# Patient Record
Sex: Female | Born: 1975 | Race: White | Hispanic: No | Marital: Married | Smoking: Never smoker
Health system: Southern US, Community
[De-identification: ages and names within clinical notes are randomized; demographics above are authoritative.]

## PROBLEM LIST (undated history)

## (undated) DIAGNOSIS — T7840XA Allergy, unspecified, initial encounter: Secondary | ICD-10-CM

## (undated) DIAGNOSIS — F32A Depression, unspecified: Secondary | ICD-10-CM

## (undated) DIAGNOSIS — F329 Major depressive disorder, single episode, unspecified: Secondary | ICD-10-CM

## (undated) DIAGNOSIS — F419 Anxiety disorder, unspecified: Secondary | ICD-10-CM

## (undated) HISTORY — PX: BREAST REDUCTION SURGERY: SHX8

## (undated) HISTORY — PX: HERNIA REPAIR: SHX51

## (undated) HISTORY — DX: Allergy, unspecified, initial encounter: T78.40XA

## (undated) HISTORY — DX: Depression, unspecified: F32.A

## (undated) HISTORY — PX: BREAST EXCISIONAL BIOPSY: SUR124

## (undated) HISTORY — PX: REDUCTION MAMMAPLASTY: SUR839

## (undated) HISTORY — DX: Major depressive disorder, single episode, unspecified: F32.9

## (undated) HISTORY — PX: GASTRIC FUNDOPLICATION: SHX226

## (undated) HISTORY — DX: Anxiety disorder, unspecified: F41.9

---

## 1995-09-10 HISTORY — PX: OTHER SURGICAL HISTORY: SHX169

## 1998-10-12 ENCOUNTER — Other Ambulatory Visit: Admission: RE | Admit: 1998-10-12 | Discharge: 1998-10-12 | Payer: Self-pay | Admitting: Plastic Surgery

## 1999-09-21 ENCOUNTER — Other Ambulatory Visit: Admission: RE | Admit: 1999-09-21 | Discharge: 1999-09-21 | Payer: Self-pay | Admitting: Obstetrics and Gynecology

## 2000-06-26 ENCOUNTER — Emergency Department (HOSPITAL_COMMUNITY): Admission: EM | Admit: 2000-06-26 | Discharge: 2000-06-26 | Payer: Self-pay | Admitting: Emergency Medicine

## 2000-09-22 ENCOUNTER — Other Ambulatory Visit: Admission: RE | Admit: 2000-09-22 | Discharge: 2000-09-22 | Payer: Self-pay | Admitting: Obstetrics and Gynecology

## 2001-09-10 ENCOUNTER — Other Ambulatory Visit: Admission: RE | Admit: 2001-09-10 | Discharge: 2001-09-10 | Payer: Self-pay | Admitting: Obstetrics and Gynecology

## 2002-04-05 ENCOUNTER — Ambulatory Visit (HOSPITAL_COMMUNITY): Admission: RE | Admit: 2002-04-05 | Discharge: 2002-04-05 | Payer: Self-pay | Admitting: Obstetrics and Gynecology

## 2002-05-20 ENCOUNTER — Other Ambulatory Visit: Admission: RE | Admit: 2002-05-20 | Discharge: 2002-05-20 | Payer: Self-pay | Admitting: Obstetrics and Gynecology

## 2002-11-29 ENCOUNTER — Inpatient Hospital Stay (HOSPITAL_COMMUNITY): Admission: AD | Admit: 2002-11-29 | Discharge: 2002-12-01 | Payer: Self-pay | Admitting: Obstetrics and Gynecology

## 2002-12-02 ENCOUNTER — Encounter: Admission: RE | Admit: 2002-12-02 | Discharge: 2003-01-01 | Payer: Self-pay | Admitting: Obstetrics and Gynecology

## 2003-01-06 ENCOUNTER — Other Ambulatory Visit: Admission: RE | Admit: 2003-01-06 | Discharge: 2003-01-06 | Payer: Self-pay | Admitting: Obstetrics and Gynecology

## 2008-01-27 ENCOUNTER — Inpatient Hospital Stay (HOSPITAL_COMMUNITY): Admission: AD | Admit: 2008-01-27 | Discharge: 2008-01-27 | Payer: Self-pay | Admitting: Obstetrics and Gynecology

## 2008-01-28 ENCOUNTER — Inpatient Hospital Stay (HOSPITAL_COMMUNITY): Admission: AD | Admit: 2008-01-28 | Discharge: 2008-01-28 | Payer: Self-pay | Admitting: *Deleted

## 2008-02-04 ENCOUNTER — Encounter: Admission: RE | Admit: 2008-02-04 | Discharge: 2008-02-04 | Payer: Self-pay | Admitting: Obstetrics and Gynecology

## 2008-02-27 ENCOUNTER — Inpatient Hospital Stay (HOSPITAL_COMMUNITY): Admission: AD | Admit: 2008-02-27 | Discharge: 2008-02-29 | Payer: Self-pay | Admitting: Obstetrics & Gynecology

## 2008-02-28 ENCOUNTER — Encounter (INDEPENDENT_AMBULATORY_CARE_PROVIDER_SITE_OTHER): Payer: Self-pay | Admitting: Obstetrics and Gynecology

## 2008-03-02 ENCOUNTER — Encounter: Admission: RE | Admit: 2008-03-02 | Discharge: 2008-03-29 | Payer: Self-pay | Admitting: Obstetrics and Gynecology

## 2009-08-16 ENCOUNTER — Encounter: Admission: RE | Admit: 2009-08-16 | Discharge: 2009-08-16 | Payer: Self-pay | Admitting: Obstetrics and Gynecology

## 2011-04-11 ENCOUNTER — Other Ambulatory Visit: Payer: Self-pay | Admitting: Obstetrics and Gynecology

## 2011-04-11 DIAGNOSIS — N63 Unspecified lump in unspecified breast: Secondary | ICD-10-CM

## 2011-04-19 ENCOUNTER — Ambulatory Visit
Admission: RE | Admit: 2011-04-19 | Discharge: 2011-04-19 | Disposition: A | Discharge status: Home / Self Care | Payer: BC Managed Care – PPO | Source: Ambulatory Visit | Attending: Obstetrics and Gynecology | Admitting: Obstetrics and Gynecology

## 2011-04-19 DIAGNOSIS — N63 Unspecified lump in unspecified breast: Secondary | ICD-10-CM

## 2011-06-06 LAB — CBC
HCT: 36.1
HCT: 36.8
Hemoglobin: 12.2
Hemoglobin: 12.5
MCHC: 33.8
MCHC: 34.1
MCV: 83.6
MCV: 84.1
Platelets: 229
Platelets: 239
RBC: 4.29
RBC: 4.4
RDW: 13.3
RDW: 13.7
WBC: 10
WBC: 12.9 — ABNORMAL HIGH

## 2011-06-06 LAB — RPR: RPR Ser Ql: NONREACTIVE

## 2013-03-03 ENCOUNTER — Encounter (INDEPENDENT_AMBULATORY_CARE_PROVIDER_SITE_OTHER): Payer: Self-pay | Admitting: General Surgery

## 2013-03-04 ENCOUNTER — Ambulatory Visit (INDEPENDENT_AMBULATORY_CARE_PROVIDER_SITE_OTHER): Payer: BC Managed Care – PPO | Admitting: General Surgery

## 2013-03-04 ENCOUNTER — Telehealth (INDEPENDENT_AMBULATORY_CARE_PROVIDER_SITE_OTHER): Payer: Self-pay

## 2013-03-04 NOTE — Telephone Encounter (Signed)
LMOM checking to see about pt missing her appt today 03/04/13 with Dr Derrell Lolling.

## 2013-03-19 ENCOUNTER — Encounter (INDEPENDENT_AMBULATORY_CARE_PROVIDER_SITE_OTHER): Payer: Self-pay | Admitting: General Surgery

## 2014-05-25 ENCOUNTER — Other Ambulatory Visit: Payer: Self-pay | Admitting: Obstetrics and Gynecology

## 2014-05-25 DIAGNOSIS — N63 Unspecified lump in unspecified breast: Secondary | ICD-10-CM

## 2014-07-05 ENCOUNTER — Other Ambulatory Visit: Payer: Self-pay

## 2014-07-05 DIAGNOSIS — Z1231 Encounter for screening mammogram for malignant neoplasm of breast: Secondary | ICD-10-CM

## 2014-08-08 ENCOUNTER — Ambulatory Visit
Admission: RE | Admit: 2014-08-08 | Discharge: 2014-08-08 | Disposition: A | Discharge status: Home / Self Care | Payer: 59 | Source: Ambulatory Visit

## 2014-08-08 DIAGNOSIS — Z1231 Encounter for screening mammogram for malignant neoplasm of breast: Secondary | ICD-10-CM

## 2015-04-25 ENCOUNTER — Other Ambulatory Visit: Payer: Self-pay | Admitting: Obstetrics and Gynecology

## 2015-04-25 DIAGNOSIS — N83202 Unspecified ovarian cyst, left side: Principal | ICD-10-CM

## 2015-04-25 DIAGNOSIS — N83201 Unspecified ovarian cyst, right side: Secondary | ICD-10-CM

## 2015-04-27 ENCOUNTER — Ambulatory Visit
Admission: RE | Admit: 2015-04-27 | Discharge: 2015-04-27 | Disposition: A | Discharge status: Home / Self Care | Payer: 59 | Source: Ambulatory Visit | Attending: Obstetrics and Gynecology | Admitting: Obstetrics and Gynecology

## 2015-04-27 DIAGNOSIS — N83201 Unspecified ovarian cyst, right side: Secondary | ICD-10-CM

## 2015-04-27 DIAGNOSIS — N83202 Unspecified ovarian cyst, left side: Principal | ICD-10-CM

## 2015-04-27 MED ORDER — IOPAMIDOL (ISOVUE-300) INJECTION 61%
100.0000 mL | Freq: Once | INTRAVENOUS | Status: AC | PRN
  Administered 2015-04-27: 100 mL via INTRAVENOUS

## 2015-06-07 ENCOUNTER — Other Ambulatory Visit: Payer: Self-pay | Admitting: Obstetrics and Gynecology

## 2015-06-09 ENCOUNTER — Encounter (HOSPITAL_COMMUNITY): Payer: Self-pay | Admitting: *Deleted

## 2015-06-14 ENCOUNTER — Ambulatory Visit (HOSPITAL_COMMUNITY): Payer: 59 | Admitting: Anesthesiology

## 2015-06-14 ENCOUNTER — Encounter (HOSPITAL_COMMUNITY): Payer: Self-pay | Admitting: Certified Registered Nurse Anesthetist

## 2015-06-14 ENCOUNTER — Ambulatory Visit (HOSPITAL_COMMUNITY)
Admission: RE | Admit: 2015-06-14 | Discharge: 2015-06-14 | Disposition: A | Discharge status: Home / Self Care | Payer: 59 | Source: Ambulatory Visit | Attending: Obstetrics and Gynecology | Admitting: Obstetrics and Gynecology

## 2015-06-14 ENCOUNTER — Encounter (HOSPITAL_COMMUNITY)
Admission: RE | Disposition: A | Discharge status: Home / Self Care | Payer: Self-pay | Source: Ambulatory Visit | Attending: Obstetrics and Gynecology

## 2015-06-14 DIAGNOSIS — Z881 Allergy status to other antibiotic agents status: Secondary | ICD-10-CM | POA: Insufficient documentation

## 2015-06-14 DIAGNOSIS — Z888 Allergy status to other drugs, medicaments and biological substances status: Secondary | ICD-10-CM | POA: Insufficient documentation

## 2015-06-14 DIAGNOSIS — F329 Major depressive disorder, single episode, unspecified: Secondary | ICD-10-CM | POA: Diagnosis not present

## 2015-06-14 DIAGNOSIS — N801 Endometriosis of ovary: Secondary | ICD-10-CM | POA: Insufficient documentation

## 2015-06-14 DIAGNOSIS — N858 Other specified noninflammatory disorders of uterus: Secondary | ICD-10-CM | POA: Diagnosis not present

## 2015-06-14 DIAGNOSIS — N7011 Chronic salpingitis: Secondary | ICD-10-CM | POA: Insufficient documentation

## 2015-06-14 DIAGNOSIS — F419 Anxiety disorder, unspecified: Secondary | ICD-10-CM | POA: Diagnosis not present

## 2015-06-14 DIAGNOSIS — D271 Benign neoplasm of left ovary: Secondary | ICD-10-CM | POA: Diagnosis not present

## 2015-06-14 HISTORY — PX: ROBOTIC ASSISTED LAPAROSCOPIC OVARIAN CYSTECTOMY: SHX6081

## 2015-06-14 LAB — CBC
HCT: 35.7 % — ABNORMAL LOW (ref 36.0–46.0)
Hemoglobin: 11.2 g/dL — ABNORMAL LOW (ref 12.0–15.0)
MCH: 23.2 pg — ABNORMAL LOW (ref 26.0–34.0)
MCHC: 31.4 g/dL (ref 30.0–36.0)
MCV: 74.1 fL — ABNORMAL LOW (ref 78.0–100.0)
Platelets: 363 10*3/uL (ref 150–400)
RBC: 4.82 MIL/uL (ref 3.87–5.11)
RDW: 17.9 % — ABNORMAL HIGH (ref 11.5–15.5)
WBC: 6.2 10*3/uL (ref 4.0–10.5)

## 2015-06-14 SURGERY — ROBOTIC ASSISTED LAPAROSCOPIC OVARIAN CYSTECTOMY
Anesthesia: General | Site: Abdomen | Laterality: Bilateral

## 2015-06-14 MED ORDER — OXYCODONE-ACETAMINOPHEN 5-325 MG PO TABS
1.0000 | ORAL_TABLET | ORAL | Status: DC | PRN
  Administered 2015-06-14: 1 via ORAL

## 2015-06-14 MED ORDER — GLYCOPYRROLATE 0.2 MG/ML IJ SOLN
INTRAMUSCULAR | Status: AC
  Filled 2015-06-14: qty 3

## 2015-06-14 MED ORDER — ONDANSETRON HCL 4 MG/2ML IJ SOLN
INTRAMUSCULAR | Status: DC | PRN
  Administered 2015-06-14: 4 mg via INTRAVENOUS

## 2015-06-14 MED ORDER — IBUPROFEN 800 MG PO TABS: 800.0000 mg | ORAL_TABLET | Freq: Three times a day (TID) | ORAL | Status: AC | PRN

## 2015-06-14 MED ORDER — SODIUM CHLORIDE 0.9 % IV SOLN
INTRAVENOUS | Status: DC | PRN
  Administered 2015-06-14: 60 mL

## 2015-06-14 MED ORDER — KETOROLAC TROMETHAMINE 30 MG/ML IJ SOLN
INTRAMUSCULAR | Status: AC
  Filled 2015-06-14: qty 1

## 2015-06-14 MED ORDER — ONDANSETRON HCL 4 MG/2ML IJ SOLN: 4.0000 mg | Freq: Once | INTRAMUSCULAR | Status: DC | PRN

## 2015-06-14 MED ORDER — PROPOFOL 10 MG/ML IV BOLUS
INTRAVENOUS | Status: DC | PRN
  Administered 2015-06-14: 200 mg via INTRAVENOUS

## 2015-06-14 MED ORDER — DEXAMETHASONE SODIUM PHOSPHATE 10 MG/ML IJ SOLN
INTRAMUSCULAR | Status: DC | PRN
  Administered 2015-06-14: 8 mg via INTRAVENOUS

## 2015-06-14 MED ORDER — KETOROLAC TROMETHAMINE 30 MG/ML IJ SOLN
INTRAMUSCULAR | Status: DC | PRN
  Administered 2015-06-14: 30 mg via INTRAVENOUS
  Administered 2015-06-14: 30 mg via INTRAMUSCULAR

## 2015-06-14 MED ORDER — FENTANYL CITRATE (PF) 100 MCG/2ML IJ SOLN
INTRAMUSCULAR | Status: AC
  Filled 2015-06-14: qty 2

## 2015-06-14 MED ORDER — NEOSTIGMINE METHYLSULFATE 10 MG/10ML IV SOLN
INTRAVENOUS | Status: AC
  Filled 2015-06-14: qty 1

## 2015-06-14 MED ORDER — MIDAZOLAM HCL 2 MG/2ML IJ SOLN
INTRAMUSCULAR | Status: DC | PRN
  Administered 2015-06-14: 2 mg via INTRAVENOUS

## 2015-06-14 MED ORDER — FENTANYL CITRATE (PF) 250 MCG/5ML IJ SOLN
INTRAMUSCULAR | Status: AC
  Filled 2015-06-14: qty 25

## 2015-06-14 MED ORDER — MIDAZOLAM HCL 2 MG/2ML IJ SOLN
INTRAMUSCULAR | Status: AC
  Filled 2015-06-14: qty 4

## 2015-06-14 MED ORDER — ROCURONIUM BROMIDE 100 MG/10ML IV SOLN
INTRAVENOUS | Status: DC | PRN
  Administered 2015-06-14: 40 mg via INTRAVENOUS
  Administered 2015-06-14: 10 mg via INTRAVENOUS
  Administered 2015-06-14: 5 mg via INTRAVENOUS

## 2015-06-14 MED ORDER — ONDANSETRON HCL 4 MG/2ML IJ SOLN
INTRAMUSCULAR | Status: AC
  Filled 2015-06-14: qty 2

## 2015-06-14 MED ORDER — ROPIVACAINE HCL 5 MG/ML IJ SOLN
INTRAMUSCULAR | Status: AC
  Filled 2015-06-14: qty 30

## 2015-06-14 MED ORDER — NEOSTIGMINE METHYLSULFATE 10 MG/10ML IV SOLN
INTRAVENOUS | Status: DC | PRN
  Administered 2015-06-14: 3 mg via INTRAVENOUS

## 2015-06-14 MED ORDER — BUPIVACAINE HCL (PF) 0.25 % IJ SOLN
INTRAMUSCULAR | Status: DC | PRN
  Administered 2015-06-14: 14 mL

## 2015-06-14 MED ORDER — LACTATED RINGERS IR SOLN
Status: DC | PRN
  Administered 2015-06-14: 3000 mL

## 2015-06-14 MED ORDER — LIDOCAINE HCL (CARDIAC) 20 MG/ML IV SOLN
INTRAVENOUS | Status: DC | PRN
  Administered 2015-06-14: 50 mg via INTRAVENOUS

## 2015-06-14 MED ORDER — GLYCOPYRROLATE 0.2 MG/ML IJ SOLN
INTRAMUSCULAR | Status: DC | PRN
  Administered 2015-06-14: 0.4 mg via INTRAVENOUS

## 2015-06-14 MED ORDER — PROPOFOL 10 MG/ML IV BOLUS
INTRAVENOUS | Status: AC
  Filled 2015-06-14: qty 20

## 2015-06-14 MED ORDER — OXYCODONE-ACETAMINOPHEN 5-325 MG PO TABS: 1.0000 | ORAL_TABLET | ORAL | Status: DC | PRN

## 2015-06-14 MED ORDER — BUPIVACAINE HCL (PF) 0.25 % IJ SOLN
INTRAMUSCULAR | Status: AC
  Filled 2015-06-14: qty 30

## 2015-06-14 MED ORDER — SCOPOLAMINE 1 MG/3DAYS TD PT72
MEDICATED_PATCH | TRANSDERMAL | Status: AC
  Administered 2015-06-14: 1.5 mg via TRANSDERMAL
  Filled 2015-06-14: qty 1

## 2015-06-14 MED ORDER — OXYCODONE-ACETAMINOPHEN 5-325 MG PO TABS
ORAL_TABLET | ORAL | Status: AC
  Filled 2015-06-14: qty 1

## 2015-06-14 MED ORDER — SODIUM CHLORIDE 0.9 % IJ SOLN
INTRAMUSCULAR | Status: AC
  Filled 2015-06-14: qty 10

## 2015-06-14 MED ORDER — LIDOCAINE HCL (PF) 1 % IJ SOLN
INTRAMUSCULAR | Status: AC
  Filled 2015-06-14: qty 5

## 2015-06-14 MED ORDER — ARTIFICIAL TEARS OP OINT
TOPICAL_OINTMENT | OPHTHALMIC | Status: AC
  Filled 2015-06-14: qty 3.5

## 2015-06-14 MED ORDER — DEXAMETHASONE SODIUM PHOSPHATE 4 MG/ML IJ SOLN
INTRAMUSCULAR | Status: AC
  Filled 2015-06-14: qty 1

## 2015-06-14 MED ORDER — SCOPOLAMINE 1 MG/3DAYS TD PT72
1.0000 | MEDICATED_PATCH | Freq: Once | TRANSDERMAL | Status: DC
  Administered 2015-06-14: 1.5 mg via TRANSDERMAL

## 2015-06-14 MED ORDER — FENTANYL CITRATE (PF) 100 MCG/2ML IJ SOLN
INTRAMUSCULAR | Status: DC | PRN
  Administered 2015-06-14 (×2): 50 ug via INTRAVENOUS
  Administered 2015-06-14: 25 ug via INTRAVENOUS
  Administered 2015-06-14: 50 ug via INTRAVENOUS

## 2015-06-14 MED ORDER — SODIUM CHLORIDE 0.9 % IJ SOLN
INTRAMUSCULAR | Status: AC
  Filled 2015-06-14: qty 50

## 2015-06-14 MED ORDER — FENTANYL CITRATE (PF) 100 MCG/2ML IJ SOLN
25.0000 ug | INTRAMUSCULAR | Status: DC | PRN
  Administered 2015-06-14 (×2): 50 ug via INTRAVENOUS

## 2015-06-14 MED ORDER — LACTATED RINGERS IV SOLN
INTRAVENOUS | Status: DC
  Administered 2015-06-14: 125 mL/h via INTRAVENOUS
  Administered 2015-06-14: 11:00:00 via INTRAVENOUS

## 2015-06-14 MED ORDER — FENTANYL CITRATE (PF) 100 MCG/2ML IJ SOLN
INTRAMUSCULAR | Status: AC
  Filled 2015-06-14: qty 4

## 2015-06-14 SURGICAL SUPPLY — 61 items
BAG SPEC RTRVL LRG 6X4 10 (ENDOMECHANICALS) ×2
BARRIER ADHS 3X4 INTERCEED (GAUZE/BANDAGES/DRESSINGS) ×5 IMPLANT
BRR ADH 4X3 ABS CNTRL BYND (GAUZE/BANDAGES/DRESSINGS) ×2
CATH FOLEY 3WAY  5CC 16FR (CATHETERS)
CATH FOLEY 3WAY 5CC 16FR (CATHETERS) ×1 IMPLANT
CHLORAPREP W/TINT 26ML (MISCELLANEOUS) ×3 IMPLANT
CONT PATH 16OZ SNAP LID 3702 (MISCELLANEOUS) ×3 IMPLANT
COVER BACK TABLE 60X90IN (DRAPES) ×6 IMPLANT
COVER TIP SHEARS 8 DVNC (MISCELLANEOUS) ×1 IMPLANT
COVER TIP SHEARS 8MM DA VINCI (MISCELLANEOUS) ×2
DECANTER SPIKE VIAL GLASS SM (MISCELLANEOUS) ×3 IMPLANT
DEVICE TROCAR PUNCTURE CLOSURE (ENDOMECHANICALS) IMPLANT
DRAPE WARM FLUID 44X44 (DRAPE) ×3 IMPLANT
DRSG COVADERM PLUS 2X2 (GAUZE/BANDAGES/DRESSINGS) ×12 IMPLANT
DRSG OPSITE POSTOP 3X4 (GAUZE/BANDAGES/DRESSINGS) ×5 IMPLANT
ELECT REM PT RETURN 9FT ADLT (ELECTROSURGICAL) ×3
ELECTRODE REM PT RTRN 9FT ADLT (ELECTROSURGICAL) ×1 IMPLANT
GAUZE VASELINE 3X9 (GAUZE/BANDAGES/DRESSINGS) IMPLANT
GLOVE BIO SURGEON STRL SZ 6.5 (GLOVE) ×2 IMPLANT
GLOVE BIO SURGEON STRL SZ7 (GLOVE) ×12 IMPLANT
GLOVE BIO SURGEONS STRL SZ 6.5 (GLOVE) ×2
GLOVE BIOGEL PI IND STRL 7.0 (GLOVE) ×2 IMPLANT
GLOVE BIOGEL PI INDICATOR 7.0 (GLOVE) ×4
GLOVE ECLIPSE 6.5 STRL STRAW (GLOVE) ×3 IMPLANT
GLOVE INDICATOR 6.5 STRL GRN (GLOVE) ×4 IMPLANT
GLOVE INDICATOR 7.0 STRL GRN (GLOVE) ×2 IMPLANT
KIT ACCESSORY DA VINCI DISP (KITS) ×2
KIT ACCESSORY DVNC DISP (KITS) ×1 IMPLANT
LIQUID BAND (GAUZE/BANDAGES/DRESSINGS) ×3 IMPLANT
NEEDLE INSUFFLATION 120MM (ENDOMECHANICALS) ×3 IMPLANT
NS IRRIG 1000ML POUR BTL (IV SOLUTION) ×9 IMPLANT
OCCLUDER COLPOPNEUMO (BALLOONS) IMPLANT
PACK ROBOT WH (CUSTOM PROCEDURE TRAY) ×3 IMPLANT
PACK ROBOTIC GOWN (GOWN DISPOSABLE) ×3 IMPLANT
PAD POSITIONING PINK XL (MISCELLANEOUS) ×3 IMPLANT
PAD PREP 24X48 CUFFED NSTRL (MISCELLANEOUS) ×6 IMPLANT
POUCH SPECIMEN RETRIEVAL 10MM (ENDOMECHANICALS) ×4 IMPLANT
SCISSORS LAP 5X35 DISP (ENDOMECHANICALS) ×2 IMPLANT
SET CYSTO W/LG BORE CLAMP LF (SET/KITS/TRAYS/PACK) IMPLANT
SET IRRIG TUBING LAPAROSCOPIC (IRRIGATION / IRRIGATOR) ×3 IMPLANT
SET TRI-LUMEN FLTR TB AIRSEAL (TUBING) IMPLANT
SUT VIC AB 0 CT1 27 (SUTURE) ×15
SUT VIC AB 0 CT1 27XBRD ANTBC (SUTURE) ×5 IMPLANT
SUT VICRYL 0 UR6 27IN ABS (SUTURE) ×3 IMPLANT
SUT VICRYL 4-0 PS2 18IN ABS (SUTURE) ×6 IMPLANT
SYR 50ML LL SCALE MARK (SYRINGE) ×3 IMPLANT
SYSTEM CONVERTIBLE TROCAR (TROCAR) IMPLANT
TIP UTERINE 5.1X6CM LAV DISP (MISCELLANEOUS) IMPLANT
TIP UTERINE 6.7X10CM GRN DISP (MISCELLANEOUS) ×2 IMPLANT
TIP UTERINE 6.7X6CM WHT DISP (MISCELLANEOUS) IMPLANT
TIP UTERINE 6.7X8CM BLUE DISP (MISCELLANEOUS) IMPLANT
TOWEL OR 17X24 6PK STRL BLUE (TOWEL DISPOSABLE) ×9 IMPLANT
TRAY FOLEY BAG SILVER LF 16FR (SET/KITS/TRAYS/PACK) ×3 IMPLANT
TROCAR DISP BLADELESS 8 DVNC (TROCAR) ×1 IMPLANT
TROCAR DISP BLADELESS 8MM (TROCAR) ×2
TROCAR OPTI TIP 12M 100M (ENDOMECHANICALS) ×3 IMPLANT
TROCAR PORT AIRSEAL 5X120 (TROCAR) IMPLANT
TROCAR XCEL NON-BLD 11X100MML (ENDOMECHANICALS) ×2 IMPLANT
TROCAR Z-THREAD 12X150 (TROCAR) ×3 IMPLANT
TUBING INSUFFLATION 10FT LAP (TUBING) ×2 IMPLANT
WARMER LAPAROSCOPE (MISCELLANEOUS) ×3 IMPLANT

## 2015-06-14 NOTE — Anesthesia Postprocedure Evaluation (Signed)
Anesthesia Post Note  Patient: Karen Higgins  Procedure(s) Performed: Procedure(s) (LRB): ROBOTIC ASSISTED LAPAROSCOPIC BILATERAL OVARIAN CYSTECTOMY/RIGHT SALPINGECTOMY/EXTENSIVE LYSIS OF ADHESIONS X 25 MIN      bILATERAL OVARIAN CYSTECTOMY   (Bilateral)  Anesthesia type: General  Patient location: PACU  Post pain: Pain level controlled  Post assessment: Post-op Vital signs reviewed  Last Vitals:  Filed Vitals:   06/14/15 1300  BP: 115/67  Pulse: 83  Temp:   Resp: 16    Post vital signs: Reviewed  Level of consciousness: sedated  Complications: No apparent anesthesia complications

## 2015-06-14 NOTE — Anesthesia Procedure Notes (Signed)
Procedure Name: Intubation Date/Time: 06/14/2015 9:02 AM Performed by: Bufford Spikes Pre-anesthesia Checklist: Patient identified, Timeout performed, Emergency Drugs available, Suction available and Patient being monitored Patient Re-evaluated:Patient Re-evaluated prior to inductionOxygen Delivery Method: Circle system utilized Preoxygenation: Pre-oxygenation with 100% oxygen Intubation Type: IV induction Ventilation: Mask ventilation without difficulty Laryngoscope Size: Miller and 2 Grade View: Grade I Tube size: 7.0 mm Number of attempts: 1 Airway Equipment and Method: Stylet Placement Confirmation: ETT inserted through vocal cords under direct vision,  breath sounds checked- equal and bilateral and positive ETCO2 Secured at: 19 (at teeth) cm Tube secured with: Tape Dental Injury: Teeth and Oropharynx as per pre-operative assessment

## 2015-06-14 NOTE — Brief Op Note (Signed)
06/14/2015  12:01 PM  PATIENT:  Karen Higgins  39 y.o. female  PRE-OPERATIVE DIAGNOSIS:  Bilateral Complex Ovarian Cysts, Left Hydrosalpinx  POST-OPERATIVE DIAGNOSIS:  left ovarian dermoid cyst, right Hydrosalpinx, stage IV endometriosis , right ovarian endometrioma  PROCEDURE:  Procedure(s): ROBOTIC ASSISTED LAPAROSCOPIC BILATERAL OVARIAN CYSTECTOMY/RIGHT SALPINGECTOMY/EXTENSIVE LYSIS OF ADHESIONS X 60 MIN      bILATERAL OVARIAN CYSTECTOMY   (Bilateral)  SURGEON:  Surgeon(s) and Role:    * Servando Salina, MD - Primary  PHYSICIAN ASSISTANT:   ASSISTANTS: Lilian Coma, CNM   ANESTHESIA:   general Findings: left ovarian dermoid, left peritubal adhesion, obliteration of posterior cul de sac, right ovarian endometrioma extending into cul de sac, right hydrosalpinx, nl left tube once lysis of adhesion, stage IV endometriosis EBL:  Total I/O In: 1000 [I.V.:1000] Out: 500 [Urine:200; Blood:300]  BLOOD ADMINISTERED:none  DRAINS: none   LOCAL MEDICATIONS USED:  MARCAINE    and BUPIVICAINE   SPECIMEN:  Source of Specimen:  right tube, right ovarian cyst wall, left dermoid cyst  DISPOSITION OF SPECIMEN:  PATHOLOGY  COUNTS:  YES  TOURNIQUET:  * No tourniquets in log *  DICTATION: .Other Dictation: Dictation Number [id number]  PLAN OF CARE: Discharge to home after PACU  PATIENT DISPOSITION:  PACU - hemodynamically stable.   Delay start of Pharmacological VTE agent (>24hrs) due to surgical blood loss or risk of bleeding: no

## 2015-06-14 NOTE — Discharge Instructions (Signed)
CALL  IF TEMP>100.4, NOTHING PER VAGINA X 2 WK, CALL IF SOAKING A MAXI  PAD EVERY HOUR OR MORE FREQUENTLY. Warm heat to abdomen every 4 hrs x 24 hrs ambulate

## 2015-06-14 NOTE — Transfer of Care (Signed)
Immediate Anesthesia Transfer of Care Note  Patient: Karen Higgins  Procedure(s) Performed: Procedure(s): ROBOTIC ASSISTED LAPAROSCOPIC BILATERAL OVARIAN CYSTECTOMY/RIGHT SALPINGECTOMY/EXTENSIVE LYSIS OF ADHESIONS X 74 MIN      bILATERAL OVARIAN CYSTECTOMY   (Bilateral)  Patient Location: PACU  Anesthesia Type:General  Level of Consciousness: awake and sedated  Airway & Oxygen Therapy: Patient Spontanous Breathing and Patient connected to nasal cannula oxygen  Post-op Assessment: Report given to RN and Post -op Vital signs reviewed and stable  Post vital signs: Reviewed and stable  Last Vitals:  Filed Vitals:   06/14/15 0718  BP: 125/77  Pulse: 72  Temp: 37 C  Resp: 16    Complications: No apparent anesthesia complications

## 2015-06-14 NOTE — Anesthesia Preprocedure Evaluation (Addendum)
Anesthesia Evaluation  Patient identified by MRN, date of birth, ID band Patient awake    Reviewed: Allergy & Precautions, NPO status , Patient's Chart, lab work & pertinent test results  History of Anesthesia Complications Negative for: history of anesthetic complications  Airway Mallampati: II  TM Distance: >3 FB Neck ROM: Full    Dental no notable dental hx. (+) Dental Advisory Given   Pulmonary neg pulmonary ROS,    Pulmonary exam normal breath sounds clear to auscultation       Cardiovascular negative cardio ROS Normal cardiovascular exam Rhythm:Regular Rate:Normal     Neuro/Psych PSYCHIATRIC DISORDERS Anxiety Depression negative neurological ROS     GI/Hepatic negative GI ROS, Neg liver ROS,   Endo/Other  negative endocrine ROS  Renal/GU negative Renal ROS  negative genitourinary   Musculoskeletal negative musculoskeletal ROS (+)   Abdominal   Peds negative pediatric ROS (+)  Hematology negative hematology ROS (+)   Anesthesia Other Findings   Reproductive/Obstetrics negative OB ROS                             Anesthesia Physical Anesthesia Plan  ASA: II  Anesthesia Plan: General   Post-op Pain Management:    Induction: Intravenous  Airway Management Planned: Oral ETT  Additional Equipment:   Intra-op Plan:   Post-operative Plan: Extubation in OR  Informed Consent: I have reviewed the patients History and Physical, chart, labs and discussed the procedure including the risks, benefits and alternatives for the proposed anesthesia with the patient or authorized representative who has indicated his/her understanding and acceptance.   Dental advisory given  Plan Discussed with: CRNA  Anesthesia Plan Comments:         Anesthesia Quick Evaluation

## 2015-06-15 ENCOUNTER — Encounter (HOSPITAL_COMMUNITY): Payer: Self-pay | Admitting: Obstetrics and Gynecology

## 2015-06-15 NOTE — Op Note (Signed)
NAMEJOURI, THREAT                ACCOUNT NO.:  [id number]  MEDICAL RECORD NO.:  [id number]  LOCATION:  [facility]                          FACILITY:  [facility]  PHYSICIAN:  Servando Salina, M.D.DATE OF BIRTH:  02/15/1976  DATE OF PROCEDURE:  06/14/2015 DATE OF DISCHARGE:  06/14/2015                              OPERATIVE REPORT   PREOPERATIVE DIAGNOSES:  Left ovarian dermoid cyst.  Bilateral complex ovarian cyst, right ovarian endometrioma, left hydrosalpinx.  PROCEDURE:  Da Vinci robotic bilateral ovarian cystectomy, extensive lysis of adhesions lasting 60 minutes, right salpingectomy.  POSTOPERATIVE DIAGNOSES:  Stage IV pelvic endometriosis.  Left ovarian dermoid cyst.  Right hydrosalpinx.  ANESTHESIA:  General.  SURGEON:  Servando Salina, M.D.  ASSISTANT:  Wilford Corner, CNM.  DESCRIPTION OF PROCEDURE:  Under adequate general anesthesia, the patient was placed in the dorsal lithotomy position.  She was positioned for robotic surgery.  The patient was sterilely prepped and draped in usual fashion.  An indwelling Foley catheter was sterilely placed. Retractors were placed in the vagina.  Cervix was parous.  The single- tooth tenaculum was placed on the anterior lip of the cervix.  The uterus sounded to 10 cm.  A #10 RUMI manipulator was introduced into the uterine cavity.  The tenaculum was removed.  The retractors were removed.  Attention was then turned to the abdomen, supraumbilical location, a 4.26% Marcaine was injected.  Vertical incision was made. Veress needle was introduced and tested with sterile water.  Opening pressure of 7 was noted, 2.7 liters of CO2 was insufflated.  Veress needle was then removed.  A 12-mm disposable trocar with sleeve was introduced into the abdomen without incident.  The robotic camera was then placed through that port.  Initial inspection revealed a single omental adhesion to the anterior abdominal wall on the right.  Normal liver edge was  noted and the patient was placed in Trendelenburg position.  There was a large left ovary containing a cystic structure. The uterus was normal with some periserosal surface adhesions and posteriorly, the posterior cul-de-sac was obliterated.  The right adnexa was adherent to the pelvic sidewall on the right.  Decision was then made to proceed with a robotic approach.  Using 10-cm distances, the 8 mm  robotic port sites on the left x2 was placed.  On the right side, a #11 assistant port was placed in the right midquadrant.  On the right, was another robotic port placed under direct visualization.  At that point, the robot was docked to the patient's left side and in arm #1, the monopolar scissor was placed; arm #2, the Prograsp was placed; in arm #3, the PK dissector was placed.  I then went to the surgical console.  At that point, the pelvis was further inspected.  No endometriosis was noted in the anterior cul-de- sac.  There were some surface of endometriosis noted on the left fundal area.  The left fallopian tube was twisted on the left ovary in a proximal manner from an adhesion.  There were some left lower quadrant adhesions as well.  Posteriorly, it was clearly evident that there was omental and bowel adhesions in the posterior cul-de-sac.  The right  ovary was not immediately identifiable and it clearly was evident that the distal end of the right fallopian tube was encased in the mass in the posterior cul-de-sac.  Using the uterine manipulator, the uterus was anteverted and allowing for better exposure in the posterior cul-de-sac. At that point, sharp dissection was then done with carefully removing of the bowel and omentum adhesions from the posterior cul-de-sac.  At which time, when it was initially started by peeling off the adhesion of the posterior aspect of the uterus, the incidental rupture of the cyst occurred with copious blood admixed with chocolate fluid was  obtained, that defects was further opened for the cyst wall to be seen and  once the cyst wall was identified, continued dissection of that ovarian cyst off the wall in the cul-de-sac was then carried out in multiple pieces and pieces removed through the assistant port.  Once that cyst was removed, it was then noted there was an adjacent daughter cyst with surrounding fluid was noted.  The right tube was then freed up at its distal portion, which was bleeding.  The right ureter was seen peristalsing directly into that area of adnexa that was adherent to the pelvic sidewall.  That additional cystic mass was then carefully removed.  It was then noted that the left uterosacral ligament was pulled up onto the left side and heaped up as well displacing the posterior cul-de-sac that was identified and cleared out from adhesions on the right.  Continued sharp and blunt dissection were then done and carefully brought out the adhesions on the left off the posterior aspect of the uterus and re-establishing the posterior cul-de-sac anatomy.  It was noted that evidence of the rectosigmoid in that area also being pulled up, but that was carefully brought down with blunt dissection. The abdomen, at that point, was irrigated and suctioned.  There was very small remaining portion of the right ovary.  At that point, once the extensive adhesiolysis had been performed with carefully bringing down the dissection, attention was then turned to the right fallopian tubes and given the torturous and swollen appearance, the decision was made to proceed with removal of that tube.  The third arm was then used to grasp the tube and the mesosalpinx was carefully, serially clamped, cauterized, and then cut.  It was subsequently just started in the proximal portion of the fallopian tube attachment to the uterus and carried proximally.  As it cleared halfway through the fallopian tube, it was easily noted that this also  could be removed off with some blunt dissection and this was done as well.  The ureter was kept in view.  The remnant of the right ovary could be seen.  Abdomen again was irrigated and suctioned.  Attention was then turned to the opposite side where the peritubal adhesions on the left proximally was lysed.  The Endobag was placed through the assistant port and brought underneath that large 5-cm approximately cystic mass on that left ovary.  Using the monopolar scissors, an incision was then made overlying the what appears to be the cystic portion of the ovary.  Copious amount of sebaceous, adhered material was exposed into the Endobag and the entire dermoid cystic mass was then removed.  Once that was removed, the bag was closed and brought up into the assistant port site.  Initial struggles continued in trying to remove the specimen out of that bag, which was very firm.  At that point, there was an opening in the  bag in the process of trying to keep it stabilize.  However, minimal fluid that come out of that because the specimen had congealed.  At that point, the specimen incidentally came out of the bag from the opposite side from having opened from areas of where the attempt of removing tissue as occurred.  It fell into the field, but remained intact.  The torn Endobag was removed and the assistant port was then reinserted.  A new Endobag was then used to bring in the specimen in the field.  At that point, once it was brought in the new bag, the opportunity was then taken to use the scissors to cut up the specimen in small pieces and in the process of doing that, incidental bone and/or teeth was found.  Once that was done, we were still able to bring up again the specimen in the bag in the assistant port site and cut in the bag, brought out the pieces of it until I was able to be remove through that assistant port site in total. At that point, again, the pelvis was irrigated and  suctioned.  The left ovary was reidentified and any small tissue of ovarian cystic tissue was removed.  Good hemostasis was achieved.  The posterior cul-de-sac area was then again inspected, no active bleeding was noted.  The serosal adhesions on the uterus were carefully removed and at that point, the procedure was felt to be completed.  The robotic instruments were then removed.  The robot was undocked.  I then went back to the patient's bedside sterilely, reinspected the pelvis, some bleeding was noted near the right ovarian tissue and using the Kleppinger, that area was carefully cauterized as well.  The abdomen was then irrigated and suctioned.  Interceed were then placed surrounding the left ovary and in the posterior cul-de-sac.  Bupivacaine was then injected in the pelvis for postoperative pain management.  The uterine manipulator was then removed.  The robotic port sites were removed as was the assistant ports and the incisions were then closed with 4-0 Vicryl subcuticular stitch. Fascial closures were done with 0 Vicryl figure-of-eight suture.  SPECIMEN:  The right fallopian tube along with the right ovarian cyst wall, the left ovarian dermoid cyst, sent to Pathology.  ESTIMATED BLOOD LOSS:  50 mL.  INTRAOPERATIVE FLUIDS:  1500 mL.  URINE OUTPUT:  About 100 mL.  COUNTS:  Sponge and instrument counts x2 were correct.  COMPLICATION:  None.  The patient tolerated the procedure very well, was transferred to the recovery room in stable condition.     Servando Salina, M.D.     [place]/MEDQ  D:  06/14/2015  T:  06/15/2015  Job:  [id number]

## 2015-06-23 ENCOUNTER — Ambulatory Visit (INDEPENDENT_AMBULATORY_CARE_PROVIDER_SITE_OTHER): Payer: 59 | Admitting: Physician Assistant

## 2015-06-23 VITALS — BP 131/84 | HR 74 | Temp 99.0°F | Resp 16 | Ht 63.5 in | Wt 190.0 lb

## 2015-06-23 DIAGNOSIS — N924 Excessive bleeding in the premenopausal period: Secondary | ICD-10-CM

## 2015-06-23 DIAGNOSIS — L309 Dermatitis, unspecified: Secondary | ICD-10-CM | POA: Diagnosis not present

## 2015-06-23 DIAGNOSIS — N83299 Other ovarian cyst, unspecified side: Secondary | ICD-10-CM | POA: Diagnosis not present

## 2015-06-23 DIAGNOSIS — N920 Excessive and frequent menstruation with regular cycle: Secondary | ICD-10-CM | POA: Insufficient documentation

## 2015-06-23 LAB — POCT CBC
Granulocyte percent: 58.6 % (ref 37–80)
HCT, POC: 33.4 % — AB (ref 37.7–47.9)
Hemoglobin: 11.2 g/dL — AB (ref 12.2–16.2)
Lymph, poc: 3.2 (ref 0.6–3.4)
MCH, POC: 23.7 pg — AB (ref 27–31.2)
MCHC: 33.5 g/dL (ref 31.8–35.4)
MCV: 70.9 fL — AB (ref 80–97)
MID (cbc): 1.1 — AB (ref 0–0.9)
MPV: 7.9 fL (ref 0–99.8)
POC Granulocyte: 6 (ref 2–6.9)
POC LYMPH PERCENT: 31.2 % (ref 10–50)
POC MID %: 10.2 % (ref 0–12)
Platelet Count, POC: 347 10*3/uL (ref 142–424)
RBC: 4.71 M/uL (ref 4.04–5.48)
RDW, POC: 17.2 %
WBC: 10.3 10*3/uL — AB (ref 4.6–10.2)

## 2015-06-23 MED ORDER — PREDNISONE 20 MG PO TABS: ORAL_TABLET | ORAL | Status: AC

## 2015-06-23 NOTE — Progress Notes (Signed)
Subjective:   Patient ID: Karen Higgins, female     DOB: June 03, 1976, 39 y.o.    MRN: [id number]  PCP: No primary care provider on file.  Chief Complaint  Patient presents with  . Allergic Reaction    from dermabond at incision sites    HPI  Presents for evaluation of itchy rash on the abdomen.  She is accompanied today by her husband, Juluis Rainier, and their younger son, Reichen.  On 10/05 she underwent a laparoscopic ovarian cystectomy with Dr. Garwin Brothers. Several days later, she developed an itchy rash around the Dermabond used to close her surgical wounds. She was re-evaluated at GYN 2 days ago and the Dermabond was removed. She was prescribed clindamycin for possible cellulitis, and Vistaril. When she had not improved yesterday, Prednisone was called in (10 mg QD). She returned to see Dr. Garwin Brothers today who told her that there was no infection, but that she was having a systemic reaction and may need an epi-pen. She was advised to come here.  Her skin is very itchy and very sensitive. Even the waist of her undergarments is uncomfortable.  She's noticed small red bumps spreading out over her abdomen, but not to her back, chest or legs.  No shortness of breath, chest tightness, lip or tongue swelling or ticking, no throat symptoms.  Prior to Admission medications   Medication Sig Start Date End Date Taking? Authorizing Provider  clindamycin (CLEOCIN) 300 MG capsule Take 300 mg by mouth every 8 (eight) hours.   Yes Historical Provider, MD  hydrOXYzine (ATARAX/VISTARIL) 10 MG tablet Take 10 mg by mouth 3 (three) times daily.    Yes Historical Provider, MD  ibuprofen (ADVIL,MOTRIN) 800 MG tablet Take 1 tablet (800 mg total) by mouth every 8 (eight) hours as needed. 06/14/15  Yes Servando Salina, MD  predniSONE (DELTASONE) 10 MG tablet Take 10 mg by mouth daily with breakfast.   Yes Historical Provider, MD     Allergies  Allergen Reactions  . Erythromycin Nausea And Vomiting  . Other       Ceclor Caps hives     There are no active problems to display for this patient.    History reviewed. No pertinent family history.   Social History   Social History  . Marital Status: Married    Spouse Name: Joneen Boers  . Number of Children: 2  . Years of Education: BFA   Occupational History  . Government social research officer   Social History Main Topics  . Smoking status: Never Smoker   . Smokeless tobacco: Never Used  . Alcohol Use: No  . Drug Use: No  . Sexual Activity: Not on file   Other Topics Concern  . Not on file   Social History Narrative   BFA from [employer].   Lives with her husband and their 2 sons.        Review of Systems  Constitutional: Negative for fever and chills.  HENT: Negative for congestion, drooling, sore throat, trouble swallowing and voice change.   Eyes: Negative for visual disturbance.  Respiratory: Negative for cough, choking, chest tightness, shortness of breath, wheezing and stridor.   Cardiovascular: Negative for chest pain, palpitations and leg swelling.  Gastrointestinal: Negative for nausea and vomiting.  Skin: Positive for rash and wound.  Neurological: Negative for dizziness, weakness, light-headedness, numbness and headaches.         Objective:  Physical Exam  Constitutional: She is oriented to person, place,  and time. She appears well-developed and well-nourished. She is active and cooperative. No distress.  BP 131/84 mmHg  Pulse 74  Temp(Src) 99 F (37.2 C)  Resp 16  Ht 5' 3.5" (1.613 m)  Wt 190 lb (86.183 kg)  BMI 33.12 kg/m2  LMP 06/07/2015   Eyes: Conjunctivae are normal.  Cardiovascular: Normal rate, regular rhythm, normal heart sounds and intact distal pulses.   Pulmonary/Chest: Effort normal and breath sounds normal.  Neurological: She is alert and oriented to person, place, and time.  Skin: Skin is warm and dry. Lesion (well-healing surgical incisions) and rash noted. Rash is  maculopapular (large patches of erythema surrounding each incision site. Scattered tiny papules covering the anterior abdomen.).  Psychiatric: She has a normal mood and affect. Her speech is normal and behavior is normal.     Results for orders placed or performed in visit on 06/23/15  POCT CBC  Result Value Ref Range   WBC 10.3 (A) 4.6 - 10.2 K/uL   Lymph, poc 3.2 0.6 - 3.4   POC LYMPH PERCENT 31.2 10 - 50 %L   MID (cbc) 1.1 (A) 0 - 0.9   POC MID % 10.2 0 - 12 %M   POC Granulocyte 6.0 2 - 6.9   Granulocyte percent 58.6 37 - 80 %G   RBC 4.71 4.04 - 5.48 M/uL   Hemoglobin 11.2 (A) 12.2 - 16.2 g/dL   HCT, POC 33.4 (A) 37.7 - 47.9 %   MCV 70.9 (A) 80 - 97 fL   MCH, POC 23.7 (A) 27 - 31.2 pg   MCHC 33.5 31.8 - 35.4 g/dL   RDW, POC 17.2 %   Platelet Count, POC 347 142 - 424 K/uL   MPV 7.9 0 - 99.8 fL           Assessment & Plan:  1. Dermatitis Likely local reaction to Dermabond, but cannot exclude reaction to clindamycin. Doubt cellulitis at this point. Stop Clindamycin. If she develops pain at the incisions, will re-start antibiotics, but select an alternative agent (Septra or Doxy). No oral or respiratory symptoms, so epinephrine or inhaled or IV steroids are not indicated. Increase oral prednisone dose to 60-60-60-40-40-40-20-20-20. Hold off on ibuprofen while on this. Continue Vistaril at HS. Use OTC non-sedating oral antihistamine for daytime. If develops oral or respiratory symptoms, go to the ER. Otherwise, follow-up here in 2 days with me. - POCT CBC - predniSONE (DELTASONE) 20 MG tablet; Take 3 PO QAM x3days, 2 PO QAM x3days, 1 PO QAM x3days  Dispense: 18 tablet; Refill: 0   Fara Chute, PA-C Physician Assistant-Certified [facility]

## 2015-06-23 NOTE — Patient Instructions (Signed)
Stop the clindamycin (Cleocin). Start the new prescription for prednisone. You may continue to use the hydroxyzine (Atarax/Vistaril), but I recommend that you reserve it for bedtime. Take an OTC non-sedating oral antihistamine (like Allegra or Claritin) each morning.

## 2016-07-02 ENCOUNTER — Other Ambulatory Visit: Payer: Self-pay | Admitting: Obstetrics and Gynecology

## 2016-07-02 DIAGNOSIS — R1031 Right lower quadrant pain: Secondary | ICD-10-CM

## 2016-07-05 ENCOUNTER — Other Ambulatory Visit: Payer: 59

## 2016-07-09 ENCOUNTER — Other Ambulatory Visit: Payer: 59

## 2016-10-11 ENCOUNTER — Other Ambulatory Visit: Payer: Self-pay | Admitting: Obstetrics and Gynecology

## 2016-10-11 DIAGNOSIS — N644 Mastodynia: Secondary | ICD-10-CM

## 2016-10-16 ENCOUNTER — Ambulatory Visit
Admission: RE | Admit: 2016-10-16 | Discharge: 2016-10-16 | Disposition: A | Discharge status: Home / Self Care | Payer: 59 | Source: Ambulatory Visit | Attending: Obstetrics and Gynecology | Admitting: Obstetrics and Gynecology

## 2016-10-16 DIAGNOSIS — N644 Mastodynia: Secondary | ICD-10-CM

## 2017-08-29 ENCOUNTER — Other Ambulatory Visit: Payer: Self-pay | Admitting: Obstetrics and Gynecology

## 2017-08-29 DIAGNOSIS — N644 Mastodynia: Secondary | ICD-10-CM

## 2017-09-05 ENCOUNTER — Ambulatory Visit
Admission: RE | Admit: 2017-09-05 | Discharge: 2017-09-05 | Disposition: A | Discharge status: Home / Self Care | Payer: 59 | Source: Ambulatory Visit | Attending: Obstetrics and Gynecology | Admitting: Obstetrics and Gynecology

## 2017-09-05 DIAGNOSIS — N644 Mastodynia: Secondary | ICD-10-CM

## 2018-01-05 ENCOUNTER — Other Ambulatory Visit: Payer: Self-pay | Admitting: Obstetrics and Gynecology

## 2018-01-05 DIAGNOSIS — Z1231 Encounter for screening mammogram for malignant neoplasm of breast: Secondary | ICD-10-CM

## 2018-01-06 ENCOUNTER — Other Ambulatory Visit: Payer: Self-pay | Admitting: Obstetrics and Gynecology

## 2018-01-06 DIAGNOSIS — N644 Mastodynia: Secondary | ICD-10-CM

## 2018-01-07 ENCOUNTER — Ambulatory Visit
Admission: RE | Admit: 2018-01-07 | Discharge: 2018-01-07 | Disposition: A | Discharge status: Home / Self Care | Payer: 59 | Source: Ambulatory Visit | Attending: Obstetrics and Gynecology | Admitting: Obstetrics and Gynecology

## 2018-01-07 ENCOUNTER — Ambulatory Visit: Payer: 59

## 2018-01-07 DIAGNOSIS — N644 Mastodynia: Secondary | ICD-10-CM

## 2019-01-18 ENCOUNTER — Other Ambulatory Visit: Payer: Self-pay | Admitting: Obstetrics and Gynecology

## 2019-01-18 DIAGNOSIS — Z1231 Encounter for screening mammogram for malignant neoplasm of breast: Secondary | ICD-10-CM

## 2019-01-25 ENCOUNTER — Ambulatory Visit
Admission: RE | Admit: 2019-01-25 | Discharge: 2019-01-25 | Disposition: A | Discharge status: Home / Self Care | Payer: BC Managed Care – PPO | Source: Ambulatory Visit | Attending: Obstetrics and Gynecology | Admitting: Obstetrics and Gynecology

## 2019-01-25 ENCOUNTER — Other Ambulatory Visit: Payer: Self-pay

## 2019-01-25 DIAGNOSIS — Z1231 Encounter for screening mammogram for malignant neoplasm of breast: Secondary | ICD-10-CM

## 2019-08-23 ENCOUNTER — Other Ambulatory Visit: Payer: Self-pay | Admitting: Obstetrics and Gynecology

## 2019-08-23 DIAGNOSIS — Z1231 Encounter for screening mammogram for malignant neoplasm of breast: Secondary | ICD-10-CM

## 2019-10-22 ENCOUNTER — Ambulatory Visit: Payer: Self-pay

## 2019-10-22 ENCOUNTER — Ambulatory Visit: Payer: BC Managed Care – PPO

## 2020-01-27 ENCOUNTER — Other Ambulatory Visit: Payer: Self-pay

## 2020-01-27 ENCOUNTER — Ambulatory Visit
Admission: RE | Admit: 2020-01-27 | Discharge: 2020-01-27 | Disposition: A | Discharge status: Home / Self Care | Payer: BC Managed Care – PPO | Source: Ambulatory Visit | Attending: Obstetrics and Gynecology | Admitting: Obstetrics and Gynecology

## 2020-01-27 DIAGNOSIS — Z1231 Encounter for screening mammogram for malignant neoplasm of breast: Secondary | ICD-10-CM

## 2020-01-28 ENCOUNTER — Ambulatory Visit: Payer: BC Managed Care – PPO

## 2022-01-28 ENCOUNTER — Other Ambulatory Visit: Payer: Self-pay | Admitting: Family Medicine

## 2022-01-28 DIAGNOSIS — Z1231 Encounter for screening mammogram for malignant neoplasm of breast: Secondary | ICD-10-CM

## 2022-02-20 ENCOUNTER — Ambulatory Visit
Admission: RE | Admit: 2022-02-20 | Discharge: 2022-02-20 | Disposition: A | Discharge status: Home / Self Care | Payer: BC Managed Care – PPO | Source: Ambulatory Visit | Attending: Family Medicine | Admitting: Family Medicine

## 2022-02-20 DIAGNOSIS — Z1231 Encounter for screening mammogram for malignant neoplasm of breast: Secondary | ICD-10-CM

## 2022-06-22 ENCOUNTER — Emergency Department (HOSPITAL_BASED_OUTPATIENT_CLINIC_OR_DEPARTMENT_OTHER): Payer: BC Managed Care – PPO

## 2022-06-22 ENCOUNTER — Encounter (HOSPITAL_BASED_OUTPATIENT_CLINIC_OR_DEPARTMENT_OTHER): Payer: Self-pay | Admitting: Radiology

## 2022-06-22 ENCOUNTER — Other Ambulatory Visit: Payer: Self-pay

## 2022-06-22 ENCOUNTER — Emergency Department (HOSPITAL_BASED_OUTPATIENT_CLINIC_OR_DEPARTMENT_OTHER)
Admission: EM | Admit: 2022-06-22 | Discharge: 2022-06-23 | Disposition: A | Discharge status: Home / Self Care | Payer: BC Managed Care – PPO | Attending: Emergency Medicine | Admitting: Emergency Medicine

## 2022-06-22 DIAGNOSIS — D72829 Elevated white blood cell count, unspecified: Secondary | ICD-10-CM | POA: Diagnosis not present

## 2022-06-22 DIAGNOSIS — J069 Acute upper respiratory infection, unspecified: Secondary | ICD-10-CM | POA: Diagnosis not present

## 2022-06-22 DIAGNOSIS — R0602 Shortness of breath: Secondary | ICD-10-CM | POA: Diagnosis present

## 2022-06-22 DIAGNOSIS — J45909 Unspecified asthma, uncomplicated: Secondary | ICD-10-CM | POA: Diagnosis not present

## 2022-06-22 LAB — CBC
HCT: 46.3 % — ABNORMAL HIGH (ref 36.0–46.0)
Hemoglobin: 15.2 g/dL — ABNORMAL HIGH (ref 12.0–15.0)
MCH: 27.7 pg (ref 26.0–34.0)
MCHC: 32.8 g/dL (ref 30.0–36.0)
MCV: 84.3 fL (ref 80.0–100.0)
Platelets: 301 10*3/uL (ref 150–400)
RBC: 5.49 MIL/uL — ABNORMAL HIGH (ref 3.87–5.11)
RDW: 14.6 % (ref 11.5–15.5)
WBC: 11.4 10*3/uL — ABNORMAL HIGH (ref 4.0–10.5)
nRBC: 0 % (ref 0.0–0.2)

## 2022-06-22 LAB — D-DIMER, QUANTITATIVE: D-Dimer, Quant: 0.82 ug/mL-FEU — ABNORMAL HIGH (ref 0.00–0.50)

## 2022-06-22 LAB — BASIC METABOLIC PANEL
Anion gap: 15 (ref 5–15)
BUN: 8 mg/dL (ref 6–20)
CO2: 21 mmol/L — ABNORMAL LOW (ref 22–32)
Calcium: 9 mg/dL (ref 8.9–10.3)
Chloride: 103 mmol/L (ref 98–111)
Creatinine, Ser: 0.67 mg/dL (ref 0.44–1.00)
GFR, Estimated: >60 mL/min (ref >60)
Glucose, Bld: 88 mg/dL (ref 70–99)
Potassium: 3.6 mmol/L (ref 3.5–5.1)
Sodium: 139 mmol/L (ref 135–145)

## 2022-06-22 MED ORDER — ALBUTEROL SULFATE HFA 108 (90 BASE) MCG/ACT IN AERS: 2.0000 | INHALATION_SPRAY | RESPIRATORY_TRACT | Status: DC | PRN

## 2022-06-22 MED ORDER — IOHEXOL 350 MG/ML SOLN
100.0000 mL | Freq: Once | INTRAVENOUS | Status: AC | PRN
  Administered 2022-06-22: 75 mL via INTRAVENOUS

## 2022-06-22 NOTE — ED Notes (Addendum)
Secure chat sent to Dr. Rogene Houston to ensure provider aware of elevated D-Dimer 0.82

## 2022-06-22 NOTE — ED Triage Notes (Signed)
Patient arrives ambulatory to traige with complaints worsening shortness of breath and coughing x1 week. Patient states that she was started on antibiotics by her pcp and prescribed breathing breathing treatments due to her asthma.    Patient still was not feeling well and she went to urgent care today. Sent her to rule out PE due to an abnormal xray.

## 2022-06-22 NOTE — ED Notes (Signed)
Late entry -- Pt asking if it is time for another breathing tx -- advised pt would have RT evaluate her --Abigail Butts RRT has since been in to see pt (refer to RT notes for further)

## 2022-06-22 NOTE — ED Notes (Signed)
Pt has now returned from [facility] via stretcher  - awake and alert; no acute changes noted

## 2022-06-22 NOTE — ED Notes (Signed)
RT called to assess pt. Pt states she has a hx of asthma. RT unable to find PFT or diagnosis of asthma for pt. Pt BLBS clear throughout w/no wheezing respiratory status stable w/no distress noted at this time. Pt appears to be very apprehensive/anxious. Pt given cool mist aerosol nebulizer for congestion/inflammation.RT will continue to monitor while in [facility] ED.

## 2022-06-22 NOTE — ED Notes (Signed)
Late entry -- Pt ambulatory to and from hall bathroom independently with steady gait - upon return to room this nurse to bedside to reconnect pt to cardiac monitor-- pt inquiring when CT will be done -- explained that CT not yet ordered; pt reports that seen at [facility] and recommendation from [facility] that CT chest be ordered d/t abnormal CXR at [facility] today -- pt then became tearful and upset verbalizing the situation is scary - pt goes on to say that she has h/o anxiety and that she takes Lexapro in the a.m. and Xanax QHS; pt also states she is starting to develop HA-- Dr. Roanna Epley has not yet responded to message regarding elevated D-Dimer- this nurse did send another secure chat requesting anxiety med for pt and making provider aware of acute HA - provider also notified via secure chat that CT recommended by [facility] due to abnormal CXR

## 2022-06-22 NOTE — Discharge Instructions (Addendum)
Commend over-the-counter medications.  Probably can finish out the current antibiotics that you are on.  Chest CT showed no evidence of any blood clot showed no evidence of pneumonia showed no evidence of any masses.  All very reassuring.  Follow back up with your regular doctor.  Work note provided.

## 2022-06-22 NOTE — ED Notes (Signed)
Patient transported to CT 

## 2022-06-23 NOTE — ED Provider Notes (Signed)
[place] [facility] DEPT Provider Note   CSN: [id number] Arrival date & time: 06/22/22  1648     History  Chief Complaint  Patient presents with   Shortness of Breath    Karen Higgins is a 46 y.o. female.  Patient referred in to urgent care for abnormal chest x-ray.  Patient's been struggling with feeling short of breath cough feeling like she has things that she needs to cough up.  Urgent care said there was some abnormalities on her chest x-ray.  She was sent here for CT.  Screening exam done out front included a D-dimer that was elevated white blood cell count 11.4 hemoglobin 84.1 basic metabolic panel normal other than CO2 21 kidney function normal.  D-dimer was 0.82.  Patient's oxygen saturations on room air are 94 to 100% patient nontoxic no acute distress.  No obvious wheezing.  Also evaluated by respiratory without any wheezing.  Patient states she has a history of asthma.  Patient is currently on doxycycline for infection and finishing up a course of prednisone started by her primary care doctor.  Past medical history significant depression and anxiety.       Home Medications Prior to Admission medications   Medication Sig Start Date End Date Taking? Authorizing Provider  clindamycin (CLEOCIN) 300 MG capsule Take 300 mg by mouth every 8 (eight) hours.    [provider]  hydrOXYzine (ATARAX/VISTARIL) 10 MG tablet Take 10 mg by mouth 3 (three) times daily.     [provider]  ibuprofen (ADVIL,MOTRIN) 800 MG tablet Take 1 tablet (800 mg total) by mouth every 8 (eight) hours as needed. 06/14/15   Servando Salina, MD  predniSONE (DELTASONE) 10 MG tablet Take 10 mg by mouth daily with breakfast.    [provider]  predniSONE (DELTASONE) 20 MG tablet Take 3 PO QAM x3days, 2 PO QAM x3days, 1 PO QAM x3days 06/23/15   Harrison Mons, PA      Allergies    Other, Wound dressing adhesive, Penicillins, Erythromycin, and Ceclor  [cefaclor]    Review of Systems   Review of Systems  Constitutional:  Negative for chills and fever.  HENT:  Negative for ear pain and sore throat.   Eyes:  Negative for pain and visual disturbance.  Respiratory:  Negative for cough, shortness of breath and wheezing.   Cardiovascular:  Negative for chest pain, palpitations and leg swelling.  Gastrointestinal:  Negative for abdominal pain and vomiting.  Genitourinary:  Negative for dysuria and hematuria.  Musculoskeletal:  Negative for arthralgias and back pain.  Skin:  Negative for color change and rash.  Neurological:  Negative for seizures and syncope.  All other systems reviewed and are negative.   Physical Exam Updated Vital Signs BP 131/89   Pulse 76   Temp 99 F (37.2 C) (Oral)   Resp 12   Ht 1.6 m ('5\' 3"'$ )   Wt 86.2 kg   LMP 06/07/2015   SpO2 97%   BMI 33.66 kg/m  Physical Exam Vitals and nursing note reviewed.  Constitutional:      General: She is not in acute distress.    Appearance: She is well-developed. She is not ill-appearing.  HENT:     Head: Normocephalic and atraumatic.  Eyes:     Extraocular Movements: Extraocular movements intact.     Conjunctiva/sclera: Conjunctivae normal.     Pupils: Pupils are equal, round, and reactive to light.  Cardiovascular:     Rate and Rhythm: Normal rate  and regular rhythm.     Heart sounds: No murmur heard. Pulmonary:     Effort: Pulmonary effort is normal. No respiratory distress.     Breath sounds: Normal breath sounds. No decreased breath sounds, wheezing, rhonchi or rales.  Chest:     Chest wall: No tenderness.  Abdominal:     Palpations: Abdomen is soft.     Tenderness: There is no abdominal tenderness.  Musculoskeletal:        General: No swelling.     Cervical back: Neck supple.     Right lower leg: No edema.     Left lower leg: No edema.  Skin:    General: Skin is warm and dry.     Capillary Refill: Capillary refill takes less than 2 seconds.   Neurological:     General: No focal deficit present.     Mental Status: She is alert and oriented to person, place, and time.  Psychiatric:        Mood and Affect: Mood normal.     ED Results / Procedures / Treatments   Labs (all labs ordered are listed, but only abnormal results are displayed) Labs Reviewed  CBC - Abnormal; Notable for the following components:      Result Value   WBC 11.4 (*)    RBC 5.49 (*)    Hemoglobin 15.2 (*)    HCT 46.3 (*)    All other components within normal limits  BASIC METABOLIC PANEL - Abnormal; Notable for the following components:   CO2 21 (*)    All other components within normal limits  D-DIMER, QUANTITATIVE - Abnormal; Notable for the following components:   D-Dimer, Quant 0.82 (*)    All other components within normal limits    EKG EKG Interpretation  Date/Time:  Saturday June 22 2022 17:15:37 EDT Ventricular Rate:  80 PR Interval:  142 QRS Duration: 70 QT Interval:  374 QTC Calculation: 431 R Axis:   53 Text Interpretation: Normal sinus rhythm Nonspecific T wave abnormality Abnormal ECG No previous ECGs available Confirmed by Fredia Sorrow [phone]) on 06/22/2022 10:33:31 PM  Radiology CT Angio Chest PE W/Cm &/Or Wo Cm  Result Date: 06/22/2022 CLINICAL DATA:  Positive D-dimer and increasing shortness of breath, initial encounter EXAM: CT ANGIOGRAPHY CHEST WITH CONTRAST TECHNIQUE: Multidetector CT imaging of the chest was performed using the standard protocol during bolus administration of intravenous contrast. Multiplanar CT image reconstructions and MIPs were obtained to evaluate the vascular anatomy. RADIATION DOSE REDUCTION: This exam was performed according to the departmental dose-optimization program which includes automated exposure control, adjustment of the mA and/or kV according to patient size and/or use of iterative reconstruction technique. CONTRAST:  31m OMNIPAQUE IOHEXOL 350 MG/ML SOLN COMPARISON:  None Available.  FINDINGS: Cardiovascular: Thoracic aorta shows no significant enhancement. No aneurysmal dilatation is seen. No cardiac enlargement is noted. The pulmonary artery shows a normal branching pattern bilaterally. No intraluminal filling defect to suggest pulmonary embolism is noted. No coronary calcifications are seen. Mediastinum/Nodes: Thoracic inlet is within normal limits. No hilar or mediastinal adenopathy is noted. The esophagus as visualized is within normal limits. Lungs/Pleura: Mild atelectatic changes are noted in the bases bilaterally. No focal infiltrate or sizable effusion is seen. Upper Abdomen: Visualized upper abdomen shows postsurgical changes in the stomach. Musculoskeletal: No chest wall abnormality. No acute or significant osseous findings. Review of the MIP images confirms the above findings. IMPRESSION: No evidence of pulmonary emboli. Mild basilar atelectasis. Electronically Signed   By:  Inez Catalina M.D.   On: 06/22/2022 23:28    Procedures Procedures    Medications Ordered in ED Medications  iohexol (OMNIPAQUE) 350 MG/ML injection 100 mL (75 mLs Intravenous Contrast Given 06/22/22 2309)    ED Course/ Medical Decision Making/ A&P                           Medical Decision Making Amount and/or Complexity of Data Reviewed Labs: ordered. Radiology: ordered.  Risk Prescription drug management.   CT angio was ordered.  No signs of any blood clots or pulmonary embolus no signs of pneumonia no signs of any masses.  Patient's oxygen saturations are very good here lungs have been very clear.  Think the patient is got an upper respiratory infection with a bronchitis picture.  I have her finish out her doxycycline finish out her prednisone course and follow-up with her doctor.  Patient's labs as mentioned in the history of present illness had a leukocytosis white count 11.4 hemoglobin 85.8 patient metabolic panel normal CO2 21 renal function normal electrolytes normal.   Final  Clinical Impression(s) / ED Diagnoses Final diagnoses:  Upper respiratory tract infection, unspecified type    Rx / DC Orders ED Discharge Orders     None         Fredia Sorrow, MD 06/23/22 0005

## 2022-06-23 NOTE — ED Notes (Signed)
Pt agreeable with d/c plan as discussed by provider- this nurse has verbally reinforced d/c instructions and provided pt with written copy - pt acknowledges verbal understanding and denies any addl questions, concerns, needs- ambulatory independently at d/c with steady gait accompanied by spouse; vitals stable; no distress.

## 2022-06-23 NOTE — ED Provider Notes (Incomplete)
[place] [facility] DEPT Provider Note   CSN: [id number] Arrival date & time: 06/22/22  1648     History {Add pertinent medical, surgical, social history, OB history to HPI:1} Chief Complaint  Patient presents with  . Shortness of Breath    Karen Higgins is a 46 y.o. female.  HPI     Home Medications Prior to Admission medications   Medication Sig Start Date End Date Taking? Authorizing Provider  clindamycin (CLEOCIN) 300 MG capsule Take 300 mg by mouth every 8 (eight) hours.    [provider]  hydrOXYzine (ATARAX/VISTARIL) 10 MG tablet Take 10 mg by mouth 3 (three) times daily.     [provider]  ibuprofen (ADVIL,MOTRIN) 800 MG tablet Take 1 tablet (800 mg total) by mouth every 8 (eight) hours as needed. 06/14/15   Servando Salina, MD  predniSONE (DELTASONE) 10 MG tablet Take 10 mg by mouth daily with breakfast.    [provider]  predniSONE (DELTASONE) 20 MG tablet Take 3 PO QAM x3days, 2 PO QAM x3days, 1 PO QAM x3days 06/23/15   Harrison Mons, PA      Allergies    Other, Wound dressing adhesive, Penicillins, Erythromycin, and Ceclor [cefaclor]    Review of Systems   Review of Systems  Physical Exam Updated Vital Signs BP 131/89   Pulse 76   Temp 99 F (37.2 C) (Oral)   Resp 12   Ht 1.6 m ('5\' 3"'$ )   Wt 86.2 kg   LMP 06/07/2015   SpO2 97%   BMI 33.66 kg/m  Physical Exam  ED Results / Procedures / Treatments   Labs (all labs ordered are listed, but only abnormal results are displayed) Labs Reviewed  CBC - Abnormal; Notable for the following components:      Result Value   WBC 11.4 (*)    RBC 5.49 (*)    Hemoglobin 15.2 (*)    HCT 46.3 (*)    All other components within normal limits  BASIC METABOLIC PANEL - Abnormal; Notable for the following components:   CO2 21 (*)    All other components within normal limits  D-DIMER, QUANTITATIVE - Abnormal; Notable for the following components:   D-Dimer,  Quant 0.82 (*)    All other components within normal limits    EKG EKG Interpretation  Date/Time:  Saturday June 22 2022 17:15:37 EDT Ventricular Rate:  80 PR Interval:  142 QRS Duration: 70 QT Interval:  374 QTC Calculation: 431 R Axis:   53 Text Interpretation: Normal sinus rhythm Nonspecific T wave abnormality Abnormal ECG No previous ECGs available Confirmed by Fredia Sorrow [phone]) on 06/22/2022 10:33:31 PM  Radiology CT Angio Chest PE W/Cm &/Or Wo Cm  Result Date: 06/22/2022 CLINICAL DATA:  Positive D-dimer and increasing shortness of breath, initial encounter EXAM: CT ANGIOGRAPHY CHEST WITH CONTRAST TECHNIQUE: Multidetector CT imaging of the chest was performed using the standard protocol during bolus administration of intravenous contrast. Multiplanar CT image reconstructions and MIPs were obtained to evaluate the vascular anatomy. RADIATION DOSE REDUCTION: This exam was performed according to the departmental dose-optimization program which includes automated exposure control, adjustment of the mA and/or kV according to patient size and/or use of iterative reconstruction technique. CONTRAST:  11m OMNIPAQUE IOHEXOL 350 MG/ML SOLN COMPARISON:  None Available. FINDINGS: Cardiovascular: Thoracic aorta shows no significant enhancement. No aneurysmal dilatation is seen. No cardiac enlargement is noted. The pulmonary artery shows a normal branching pattern bilaterally. No intraluminal filling defect to suggest pulmonary  embolism is noted. No coronary calcifications are seen. Mediastinum/Nodes: Thoracic inlet is within normal limits. No hilar or mediastinal adenopathy is noted. The esophagus as visualized is within normal limits. Lungs/Pleura: Mild atelectatic changes are noted in the bases bilaterally. No focal infiltrate or sizable effusion is seen. Upper Abdomen: Visualized upper abdomen shows postsurgical changes in the stomach. Musculoskeletal: No chest wall abnormality. No acute or  significant osseous findings. Review of the MIP images confirms the above findings. IMPRESSION: No evidence of pulmonary emboli. Mild basilar atelectasis. Electronically Signed   By: Inez Catalina M.D.   On: 06/22/2022 23:28    Procedures Procedures  {Document cardiac monitor, telemetry assessment procedure when appropriate:1}  Medications Ordered in ED Medications  iohexol (OMNIPAQUE) 350 MG/ML injection 100 mL (75 mLs Intravenous Contrast Given 06/22/22 2309)    ED Course/ Medical Decision Making/ A&P                           Medical Decision Making Amount and/or Complexity of Data Reviewed Labs: ordered. Radiology: ordered.  Risk Prescription drug management.   ***  {Document critical care time when appropriate:1} {Document review of labs and clinical decision tools ie heart score, Chads2Vasc2 etc:1}  {Document your independent review of radiology images, and any outside records:1} {Document your discussion with family members, caretakers, and with consultants:1} {Document social determinants of health affecting pt's care:1} {Document your decision making why or why not admission, treatments were needed:1} Final Clinical Impression(s) / ED Diagnoses Final diagnoses:  Upper respiratory tract infection, unspecified type    Rx / DC Orders ED Discharge Orders     None

## 2023-02-04 ENCOUNTER — Other Ambulatory Visit: Payer: Self-pay | Admitting: Family Medicine

## 2023-02-04 DIAGNOSIS — Z1231 Encounter for screening mammogram for malignant neoplasm of breast: Secondary | ICD-10-CM

## 2023-03-01 IMAGING — MG MM DIGITAL SCREENING BILAT W/ TOMO AND CAD
8 series · 8 of 24 positions shown · non-contrast
Comparison: Previous exam(s).

CLINICAL DATA: Screening.

EXAM:
DIGITAL SCREENING BILATERAL MAMMOGRAM WITH TOMOSYNTHESIS AND CAD
TECHNIQUE: Bilateral screening digital craniocaudal and mediolateral oblique
mammograms were obtained. Bilateral screening digital breast
tomosynthesis was performed. The images were evaluated with
computer-aided detection.

[R CC synth-2D]
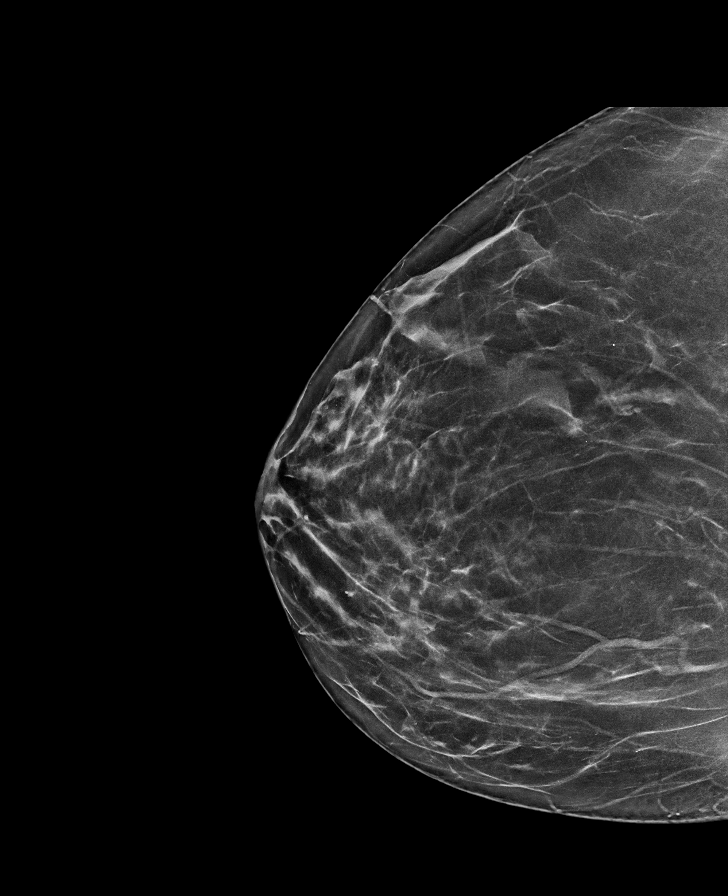

[R MLO synth-2D]
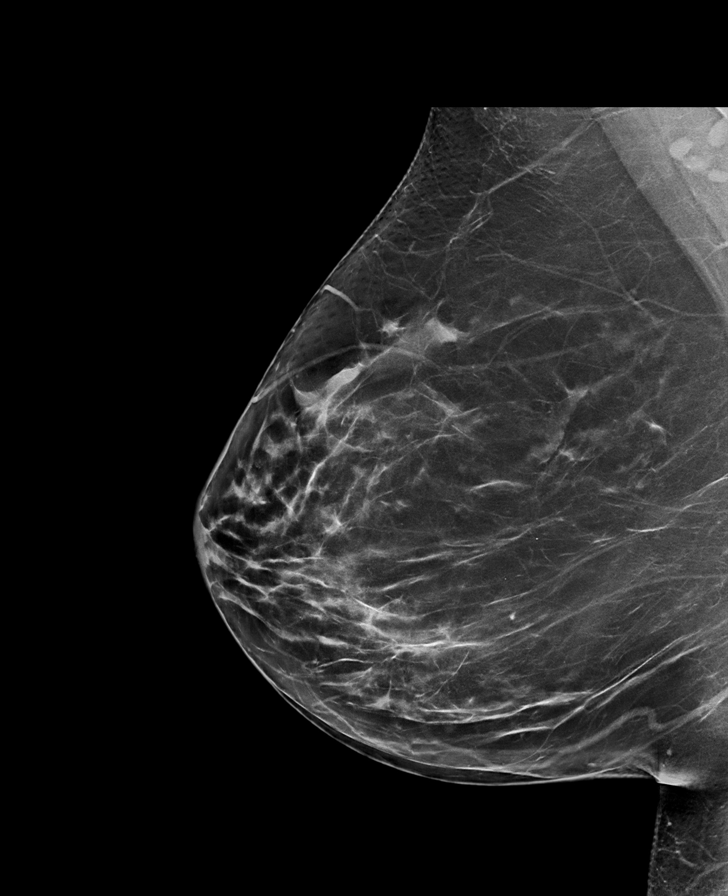

[L CC synth-2D]
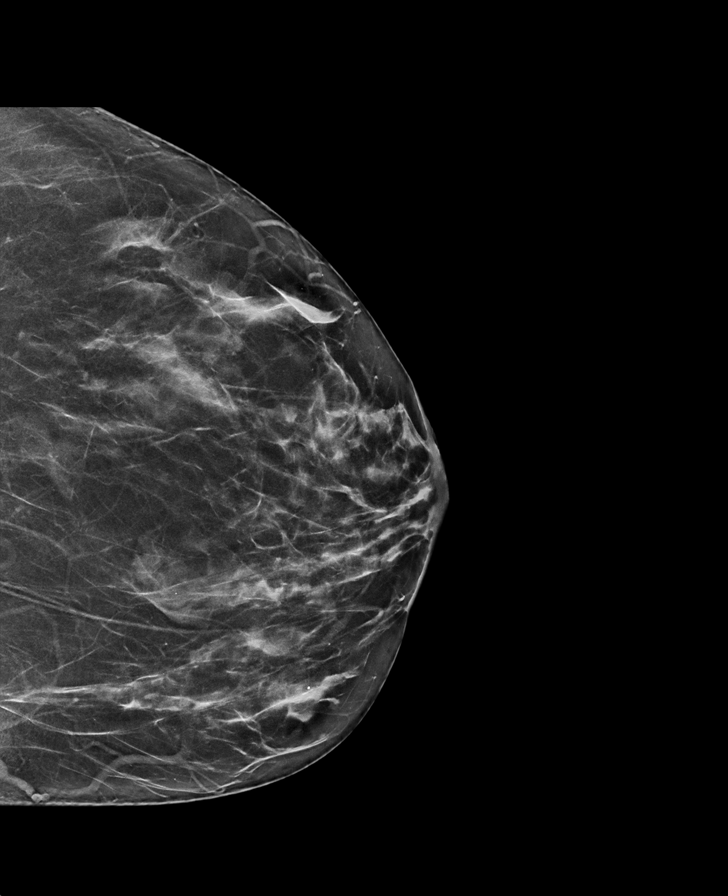

[L MLO synth-2D]
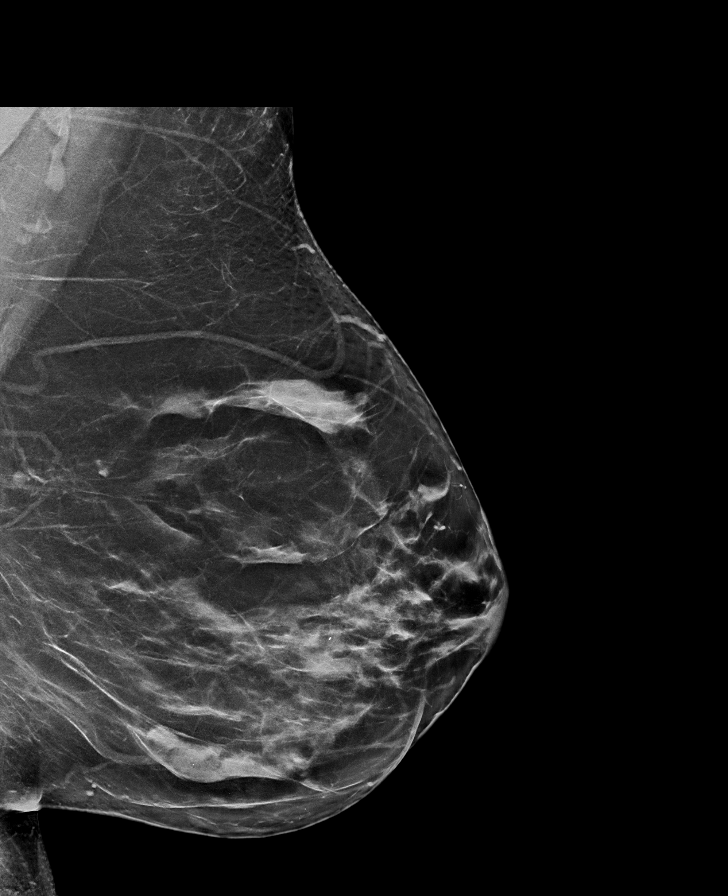

[R CC tomo · tomo slice 35/68.0]
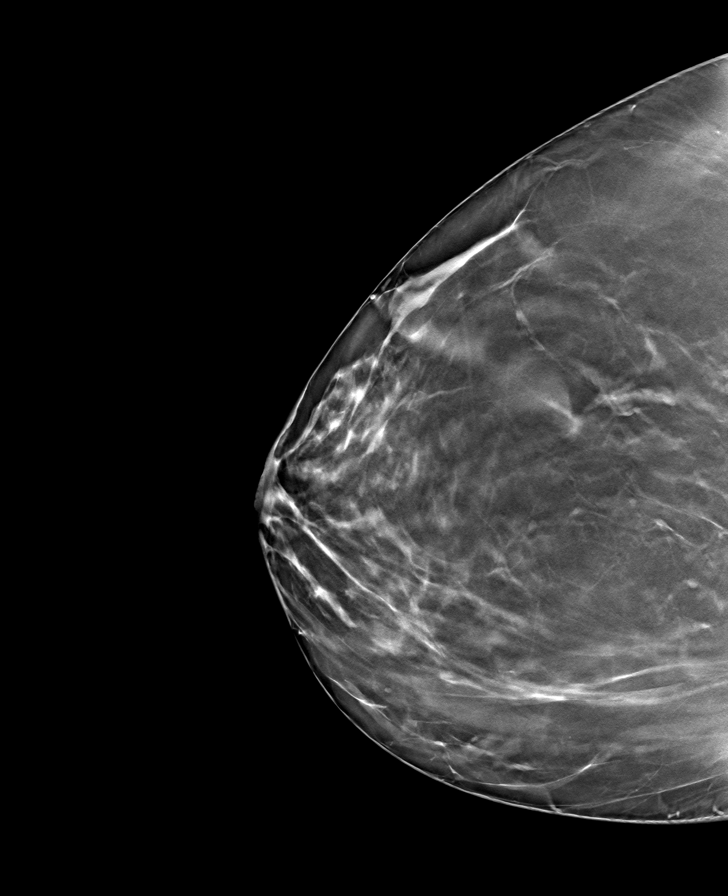

[L CC tomo · tomo slice 34/67.0]
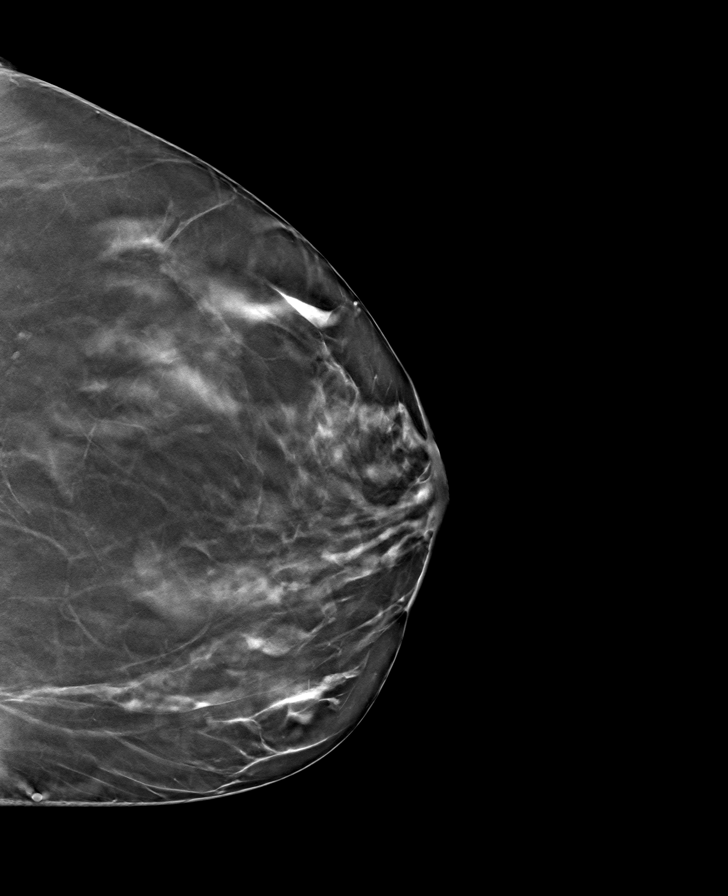

[L MLO tomo · tomo slice 39/77.0]
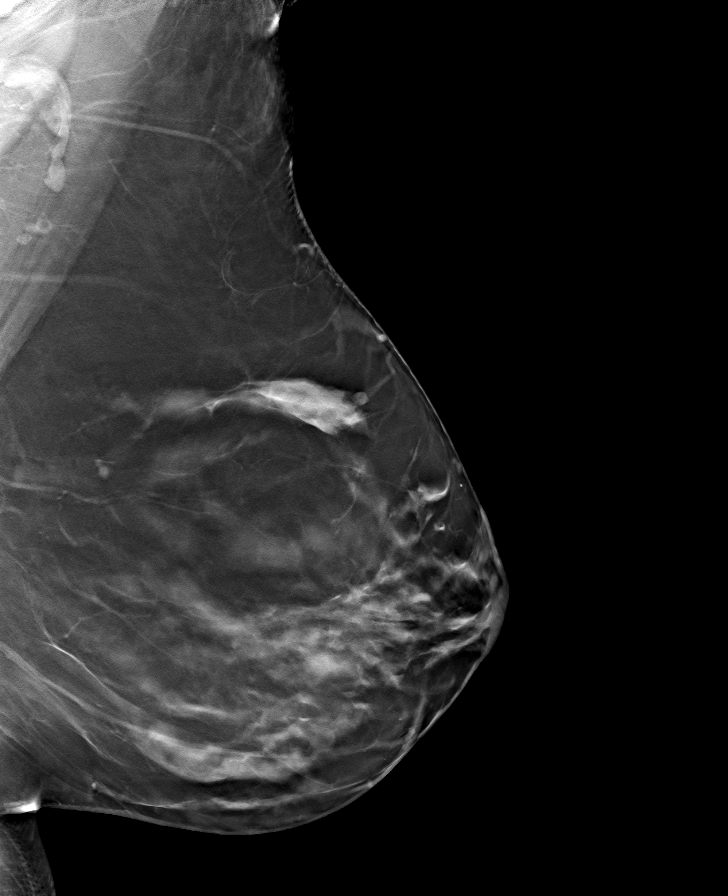

[R MLO tomo · tomo slice 41/80.0]
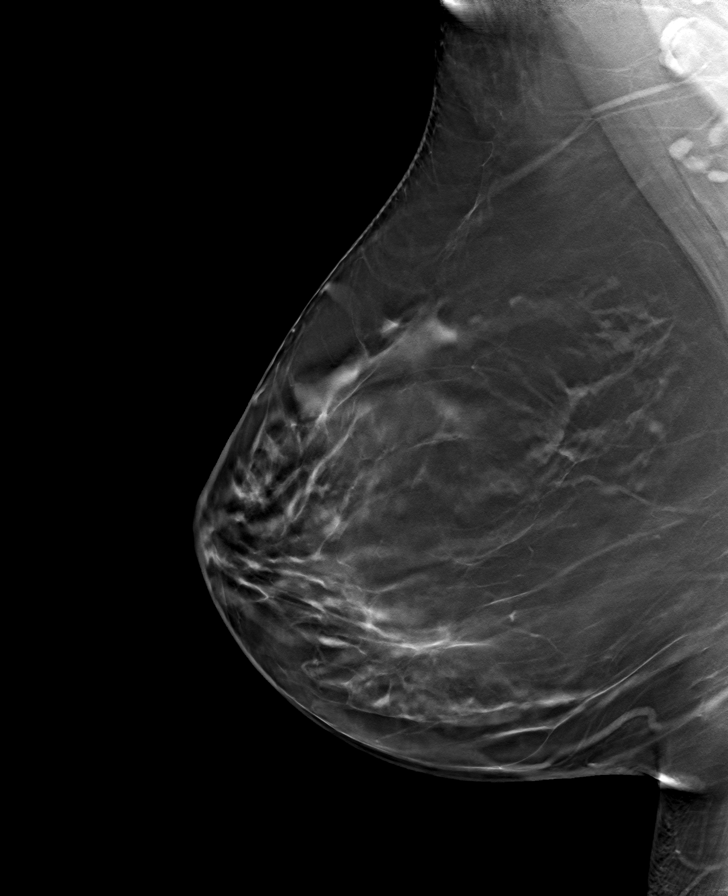

[8 of 24 positions shown; findings below may reference images not displayed]

ACR Breast Density Category b: There are scattered areas of
fibroglandular density.
FINDINGS: Reduction mammoplasty changes are noted in both breasts. There are
no findings suspicious for malignancy.
IMPRESSION: No mammographic evidence of malignancy. A result letter of this
screening mammogram will be mailed directly to the patient.

RECOMMENDATION:
Screening mammogram in one year. (Code:A5-V-BHR)

BI-RADS CATEGORY  2: Benign.

## 2023-03-04 ENCOUNTER — Other Ambulatory Visit: Payer: Self-pay | Admitting: Physician Assistant

## 2023-03-04 DIAGNOSIS — Z1239 Encounter for other screening for malignant neoplasm of breast: Secondary | ICD-10-CM

## 2023-03-05 ENCOUNTER — Other Ambulatory Visit: Payer: Self-pay | Admitting: Physician Assistant

## 2023-03-05 DIAGNOSIS — Z1239 Encounter for other screening for malignant neoplasm of breast: Secondary | ICD-10-CM

## 2023-03-05 DIAGNOSIS — N644 Mastodynia: Secondary | ICD-10-CM

## 2023-03-12 ENCOUNTER — Ambulatory Visit
Admission: RE | Admit: 2023-03-12 | Discharge: 2023-03-12 | Disposition: A | Discharge status: Home / Self Care | Payer: BC Managed Care – PPO | Source: Ambulatory Visit | Attending: Physician Assistant | Admitting: Physician Assistant

## 2023-03-12 DIAGNOSIS — N644 Mastodynia: Secondary | ICD-10-CM

## 2023-08-15 ENCOUNTER — Encounter (HOSPITAL_BASED_OUTPATIENT_CLINIC_OR_DEPARTMENT_OTHER): Payer: Self-pay | Admitting: Radiology

## 2024-03-08 ENCOUNTER — Other Ambulatory Visit: Payer: Self-pay | Admitting: Physician Assistant

## 2024-03-08 DIAGNOSIS — Z1231 Encounter for screening mammogram for malignant neoplasm of breast: Secondary | ICD-10-CM

## 2024-03-15 ENCOUNTER — Ambulatory Visit
Admission: RE | Admit: 2024-03-15 | Discharge: 2024-03-15 | Disposition: A | Discharge status: Home / Self Care | Payer: Self-pay | Source: Ambulatory Visit | Attending: Physician Assistant

## 2024-03-15 DIAGNOSIS — Z1231 Encounter for screening mammogram for malignant neoplasm of breast: Secondary | ICD-10-CM

## 2024-04-01 ENCOUNTER — Other Ambulatory Visit: Payer: Self-pay | Admitting: Physician Assistant

## 2024-04-01 DIAGNOSIS — Z1231 Encounter for screening mammogram for malignant neoplasm of breast: Secondary | ICD-10-CM

## 2025-03-16 ENCOUNTER — Ambulatory Visit
